# Patient Record
Sex: Male | Born: 1950 | Race: Black or African American | Hispanic: No | Smoking: Never smoker
Health system: Southern US, Community
[De-identification: ages and names within clinical notes are randomized; demographics above are authoritative.]

## PROBLEM LIST (undated history)

## (undated) DIAGNOSIS — C649 Malignant neoplasm of unspecified kidney, except renal pelvis: Secondary | ICD-10-CM

## (undated) DIAGNOSIS — I10 Essential (primary) hypertension: Secondary | ICD-10-CM

---

## 2018-03-23 ENCOUNTER — Emergency Department (HOSPITAL_COMMUNITY)
Admission: EM | Admit: 2018-03-23 | Discharge: 2018-03-23 | Disposition: A | Discharge status: Home / Self Care | Payer: Medicare Other | Attending: Emergency Medicine | Admitting: Emergency Medicine

## 2018-03-23 ENCOUNTER — Encounter (HOSPITAL_COMMUNITY): Payer: Self-pay | Admitting: Emergency Medicine

## 2018-03-23 ENCOUNTER — Emergency Department (HOSPITAL_COMMUNITY): Payer: Medicare Other

## 2018-03-23 DIAGNOSIS — E860 Dehydration: Secondary | ICD-10-CM | POA: Diagnosis not present

## 2018-03-23 DIAGNOSIS — R55 Syncope and collapse: Secondary | ICD-10-CM | POA: Diagnosis present

## 2018-03-23 DIAGNOSIS — E876 Hypokalemia: Secondary | ICD-10-CM | POA: Insufficient documentation

## 2018-03-23 LAB — CBC WITH DIFFERENTIAL/PLATELET
BASOS ABS: 0 10*3/uL (ref 0.0–0.1)
BASOS PCT: 0 %
Eosinophils Absolute: 0 10*3/uL (ref 0.0–0.7)
Eosinophils Relative: 0 %
HCT: 39.2 % (ref 39.0–52.0)
Hemoglobin: 13.6 g/dL (ref 13.0–17.0)
Lymphocytes Relative: 17 %
Lymphs Abs: 1.2 10*3/uL (ref 0.7–4.0)
MCH: 30.2 pg (ref 26.0–34.0)
MCHC: 34.7 g/dL (ref 30.0–36.0)
MCV: 86.9 fL (ref 78.0–100.0)
MONO ABS: 0.9 10*3/uL (ref 0.1–1.0)
Monocytes Relative: 12 %
Neutro Abs: 4.9 10*3/uL (ref 1.7–7.7)
Neutrophils Relative %: 71 %
PLATELETS: 193 10*3/uL (ref 150–400)
RBC: 4.51 MIL/uL (ref 4.22–5.81)
RDW: 14.2 % (ref 11.5–15.5)
WBC: 7 10*3/uL (ref 4.0–10.5)

## 2018-03-23 LAB — LIPASE, BLOOD: Lipase: 41 U/L (ref 11–51)

## 2018-03-23 LAB — COMPREHENSIVE METABOLIC PANEL
ALT: 20 U/L (ref 17–63)
ANION GAP: 14 (ref 5–15)
AST: 30 U/L (ref 15–41)
Albumin: 3.6 g/dL (ref 3.5–5.0)
Alkaline Phosphatase: 36 U/L — ABNORMAL LOW (ref 38–126)
BUN: 43 mg/dL — ABNORMAL HIGH (ref 6–20)
CHLORIDE: 101 mmol/L (ref 101–111)
CO2: 24 mmol/L (ref 22–32)
Calcium: 8.5 mg/dL — ABNORMAL LOW (ref 8.9–10.3)
Creatinine, Ser: 2.45 mg/dL — ABNORMAL HIGH (ref 0.61–1.24)
GFR calc non Af Amer: 26 mL/min — ABNORMAL LOW (ref >60)
GFR, EST AFRICAN AMERICAN: 30 mL/min — AB (ref >60)
Glucose, Bld: 115 mg/dL — ABNORMAL HIGH (ref 65–99)
POTASSIUM: 2.7 mmol/L — AB (ref 3.5–5.1)
Sodium: 139 mmol/L (ref 135–145)
Total Bilirubin: 1.1 mg/dL (ref 0.3–1.2)
Total Protein: 6.4 g/dL — ABNORMAL LOW (ref 6.5–8.1)

## 2018-03-23 LAB — I-STAT TROPONIN, ED: Troponin i, poc: 0.05 ng/mL (ref 0.00–0.08)

## 2018-03-23 MED ORDER — POTASSIUM CHLORIDE CRYS ER 20 MEQ PO TBCR
40.0000 meq | EXTENDED_RELEASE_TABLET | Freq: Once | ORAL | Status: AC
  Administered 2018-03-23: 40 meq via ORAL
  Filled 2018-03-23: qty 2

## 2018-03-23 MED ORDER — SODIUM CHLORIDE 0.9 % IV BOLUS
1000.0000 mL | Freq: Once | INTRAVENOUS | Status: AC
  Administered 2018-03-23: 1000 mL via INTRAVENOUS

## 2018-03-23 MED ORDER — ONDANSETRON HCL 4 MG/2ML IJ SOLN
4.0000 mg | Freq: Once | INTRAMUSCULAR | Status: AC
  Administered 2018-03-23: 4 mg via INTRAVENOUS
  Filled 2018-03-23: qty 2

## 2018-03-23 NOTE — ED Triage Notes (Signed)
Pt arrives via EMS with complaints of dizziness. Denies any SOB, CP or LOC. Reports this has happened before and he has been seen for it. Reports a recent stomach virus.

## 2018-03-23 NOTE — Discharge Instructions (Addendum)
Your evaluated in the emergency department for a syncopal event.  We found you to be dehydrated and low in potassium.  We have given you some fluids and some oral potassium and you were improved.  You were offered admission for continued fluids and observation but you wish to go home.  It is important that you stay well-hydrated and I would recommend Gatorade or other electrolyte solutions to replete some of your electrolytes.  Please see your doctor tomorrow and have them recheck your labs.  Today your potassium was 2.7 and your creatinine was 2.45.  This was prior to getting medications and fluids.

## 2018-03-23 NOTE — ED Provider Notes (Signed)
[facility] EMERGENCY DEPARTMENT Provider Note   CSN: [id number] Arrival date & time: 03/23/18  1313     History   Chief Complaint Chief Complaint  Patient presents with  . Dizziness    HPI Zachary Baker is a 67 y.o. male.  Patient presents to the ED after experiencing a syncopal event at home.  His significant other was there and states he was leaving the house, felt a little nauseous and then he started staggering and was assisted to the ground.  It sounds like he improved after being on the ground and then felt lightheaded again and EMS was called.  Since then he has been alert and feels back to baseline.  Says felt a little nauseous over the past few days.  He relates that he had a head injury in the service about 40 years ago and since then he has had syncopal events.  He states he usually gets them multiple times a year if not more frequently.  He is not known to have any heart history.  The history is provided by the patient, a relative and the spouse.  Loss of Consciousness   This is a recurrent problem. The current episode started less than 1 hour ago. The problem has been resolved. He lost consciousness for a period of less than one minute. The problem is associated with normal activity. Associated symptoms include clumsiness, diaphoresis, dizziness and nausea. Pertinent negatives include abdominal pain, back pain, chest pain, fever, palpitations, seizures and vomiting. He has tried relaxation for the symptoms. The treatment provided significant relief.    History reviewed. No pertinent past medical history.  There are no active problems to display for this patient.   History reviewed. No pertinent surgical history.      Home Medications    Prior to Admission medications   Not on File    Family History No family history on file.  Social History Social History   Tobacco Use  . Smoking status: Not on file  Substance Use Topics  . Alcohol use:  Not on file  . Drug use: Not on file  denies drugs   Allergies   Patient has no known allergies.   Review of Systems Review of Systems  Constitutional: Positive for diaphoresis. Negative for chills and fever.  HENT: Negative for ear pain and sore throat.   Eyes: Negative for pain and visual disturbance.  Respiratory: Negative for cough and shortness of breath.   Cardiovascular: Positive for syncope. Negative for chest pain and palpitations.  Gastrointestinal: Positive for nausea. Negative for abdominal pain and vomiting.  Genitourinary: Negative for dysuria and hematuria.  Musculoskeletal: Negative for arthralgias and back pain.  Skin: Negative for color change and rash.  Neurological: Positive for dizziness. Negative for seizures and syncope.  All other systems reviewed and are negative.    Physical Exam Updated Vital Signs BP (!) 141/85   Pulse 67   Temp 97.8 F (36.6 C) (Oral)   Resp 16   SpO2 99%   Physical Exam  Constitutional: He is oriented to person, place, and time. He appears well-developed and well-nourished.  HENT:  Head: Normocephalic and atraumatic.  Eyes: Conjunctivae are normal.  Neck: Neck supple.  Cardiovascular: Normal rate and regular rhythm.  No murmur heard. Pulmonary/Chest: Effort normal and breath sounds normal. No respiratory distress.  Abdominal: Soft. There is no tenderness.  Musculoskeletal: Normal range of motion. He exhibits no edema, tenderness or deformity.  Neurological: He is alert and oriented  to person, place, and time. He has normal strength. No cranial nerve deficit or sensory deficit. GCS eye subscore is 4. GCS verbal subscore is 5. GCS motor subscore is 6.  Skin: Skin is warm and dry. Capillary refill takes less than 2 seconds.  Psychiatric: He has a normal mood and affect.  Nursing note and vitals reviewed.    ED Treatments / Results  Labs (all labs ordered are listed, but only abnormal results are displayed) Labs  Reviewed  COMPREHENSIVE METABOLIC PANEL - Abnormal; Notable for the following components:      Result Value   Potassium 2.7 (*)    Glucose, Bld 115 (*)    BUN 43 (*)    Creatinine, Ser 2.45 (*)    Calcium 8.5 (*)    Total Protein 6.4 (*)    Alkaline Phosphatase 36 (*)    GFR calc non Af Amer 26 (*)    GFR calc Af Amer 30 (*)    All other components within normal limits  CBC WITH DIFFERENTIAL/PLATELET  LIPASE, BLOOD  CBG MONITORING, ED  I-STAT TROPONIN, ED    EKG EKG Interpretation  Date/Time:  Sunday Mar 23 2018 14:02:07 EDT Ventricular Rate:  62 PR Interval:    QRS Duration: 93 QT Interval:  468 QTC Calculation: 476 R Axis:   29 Text Interpretation:  Sinus rhythm Borderline repolarization abnormality Minimal ST elevation, anterior leads Borderline prolonged QT interval no prior to compare with Confirmed by Aletta Edouard [phone]) on 03/23/2018 2:05:49 PM Also confirmed by Aletta Edouard [phone]), editor Abelardo Diesel [phone])  on 03/23/2018 2:12:17 PM   Radiology Dg Chest 2 View  Result Date: 03/23/2018 CLINICAL DATA:  Syncopal episode.  Nausea with dizziness. EXAM: CHEST - 2 VIEW COMPARISON:  None. FINDINGS: Lordotic positioning on the AP view. The heart size and mediastinal contours are normal. There is mild aortic atherosclerosis. The lungs are clear. There is no pleural effusion or pneumothorax. No acute osseous findings are evident. Telemetry leads overlie the chest. IMPRESSION: No active cardiopulmonary process. Electronically Signed   By: Richardean Sale M.D.   On: 03/23/2018 14:38    Procedures Procedures (including critical care time)  Medications Ordered in ED Medications  sodium chloride 0.9 % bolus 1,000 mL (0 mLs Intravenous Stopped 03/23/18 1539)  ondansetron (ZOFRAN) injection 4 mg (4 mg Intravenous Given 03/23/18 1408)  potassium chloride SA (K-DUR,KLOR-CON) CR tablet 40 mEq (40 mEq Oral Given 03/23/18 1543)     Initial Impression / Assessment and Plan / ED  Course  I have reviewed the triage vital signs and the nursing notes.  Pertinent labs & imaging results that were available during my care of the patient were reviewed by me and considered in my medical decision making (see chart for details).  Clinical Course as of Mar 24 802  Sun Mar 23, 2018  1619 Reviewed patient's prior labs and his baseline creatinine is 115 but he had admission about a year ago for similar presentation of AKI and  hypokalemia.  We are repleting his potassium and is gotten some IV fluids.  He feels back to baseline and wants to be discharged.  I recommend to him that he stay overnight in the hospital for continued management but he states he wants to be discharged and go to the [place] tomorrow.   [MB]    Clinical Course User Index [MB] Hayden Rasmussen, MD     Final Clinical Impressions(s) / ED Diagnoses   Final diagnoses:  Dehydration  Hypokalemia    ED Discharge Orders    None       Hayden Rasmussen, MD 03/24/18 [phone]

## 2018-03-23 NOTE — ED Notes (Signed)
Patient transported to X-ray 

## 2018-03-24 ENCOUNTER — Encounter (HOSPITAL_COMMUNITY): Payer: Self-pay | Admitting: Emergency Medicine

## 2018-04-13 ENCOUNTER — Emergency Department (HOSPITAL_COMMUNITY)
Admission: EM | Admit: 2018-04-13 | Discharge: 2018-04-13 | Disposition: A | Discharge status: Home / Self Care | Payer: Medicare Other | Attending: Emergency Medicine | Admitting: Emergency Medicine

## 2018-04-13 ENCOUNTER — Encounter (HOSPITAL_COMMUNITY): Payer: Self-pay | Admitting: Emergency Medicine

## 2018-04-13 ENCOUNTER — Other Ambulatory Visit: Payer: Self-pay

## 2018-04-13 DIAGNOSIS — H1033 Unspecified acute conjunctivitis, bilateral: Secondary | ICD-10-CM | POA: Diagnosis present

## 2018-04-13 DIAGNOSIS — I10 Essential (primary) hypertension: Secondary | ICD-10-CM | POA: Insufficient documentation

## 2018-04-13 HISTORY — DX: Essential (primary) hypertension: I10

## 2018-04-13 MED ORDER — KETOROLAC TROMETHAMINE 0.5 % OP SOLN
1.0000 [drp] | Freq: Once | OPHTHALMIC | Status: AC
Start: 2018-04-13 — End: 2018-04-13
  Administered 2018-04-13: 1 [drp] via OPHTHALMIC
  Filled 2018-04-13: qty 5

## 2018-04-13 MED ORDER — TOBRAMYCIN 0.3 % OP SOLN
1.0000 [drp] | Freq: Once | OPHTHALMIC | Status: AC
  Administered 2018-04-13: 1 [drp] via OPHTHALMIC
  Filled 2018-04-13: qty 5

## 2018-04-13 NOTE — Discharge Instructions (Addendum)
Apply both of the drops in both of your eyes.  Apply 1 drop of the Tobrex which is the antibiotic every 4 hours while awake for the next 7 days.  The other drop which is called ketorolac is an anti-inflammatory which will help with the redness and irritation can be applied to 3 times daily, 1 drop.  Avoid rubbing your eyes, wash your hands frequently as this infection is contagious as discussed.

## 2018-04-13 NOTE — ED Provider Notes (Signed)
[facility] EMERGENCY DEPARTMENT Provider Note   CSN: [id number] Arrival date & time: 04/13/18  1053     History   Chief Complaint Chief Complaint  Patient presents with  . Eye Problem    HPI Nahsir Venezia is a 67 y.o. male presenting with eye irritation including redness and drainage with crusting along his eyelid margins which is worsened upon first waking. His symptoms started 2 days ago in the left eye and then spread to the right yesterday.  He denies vision changes, eye pain, except for a scratchy feeling with blinking, denies foreign body sensation.  He has had no treatment prior to arrival.  He has had no known contacts with anyone with pink eye.  The history is provided by the patient.    Past Medical History:  Diagnosis Date  . Hypertension     There are no active problems to display for this patient.   History reviewed. No pertinent surgical history.      Home Medications    Prior to Admission medications   Not on File    Family History No family history on file.  Social History Social History   Tobacco Use  . Smoking status: Never Smoker  . Smokeless tobacco: Never Used  Substance Use Topics  . Alcohol use: Yes    Alcohol/week: 1.2 oz    Types: 2 Glasses of wine per week    Comment: daily   . Drug use: Not Currently     Allergies   Patient has no known allergies.   Review of Systems Review of Systems  Constitutional: Negative for chills and fever.  HENT: Negative for congestion, ear pain, rhinorrhea, sinus pressure, sore throat, trouble swallowing and voice change.   Eyes: Positive for discharge and redness. Negative for photophobia, pain, itching and visual disturbance.  Respiratory: Negative for cough, shortness of breath, wheezing and stridor.   Cardiovascular: Negative for chest pain.  Gastrointestinal: Negative for abdominal pain.  Genitourinary: Negative.      Physical Exam Updated Vital Signs BP 123/70   Pulse 65    Temp 98.6 F (37 C) (Oral)   Resp 16   Ht 5\' 11"  (1.803 m)   Wt 85.3 kg (188 lb)   SpO2 100%   BMI 26.22 kg/m   Physical Exam  Constitutional: He is oriented to person, place, and time. He appears well-developed and well-nourished.  HENT:  Head: Normocephalic and atraumatic.  Right Ear: Tympanic membrane and ear canal normal.  Left Ear: Tympanic membrane and ear canal normal.  Nose: No mucosal edema or rhinorrhea.  Mouth/Throat: Uvula is midline, oropharynx is clear and moist and mucous membranes are normal. No oropharyngeal exudate, posterior oropharyngeal edema, posterior oropharyngeal erythema or tonsillar abscesses.  Eyes: Right eye exhibits discharge. Left eye exhibits discharge. Right conjunctiva is injected. Left conjunctiva is injected.  Visual acuity  OS 20/20 OD 20/20  OU 20/20  Cardiovascular: Normal rate.  Pulmonary/Chest: Effort normal.  Musculoskeletal: Normal range of motion.  Neurological: He is alert and oriented to person, place, and time.  Skin: Skin is warm and dry. No rash noted.  Psychiatric: He has a normal mood and affect.     ED Treatments / Results  Labs (all labs ordered are listed, but only abnormal results are displayed) Labs Reviewed - No data to display  EKG None  Radiology No results found.  Procedures Procedures (including critical care time)  Medications Ordered in ED Medications  tobramycin (TOBREX) 0.3 % ophthalmic solution 1  drop (1 drop Both Eyes Given 04/13/18 1238)  ketorolac (ACULAR) 0.5 % ophthalmic solution 1 drop (1 drop Both Eyes Given 04/13/18 1236)     Initial Impression / Assessment and Plan / ED Course  I have reviewed the triage vital signs and the nursing notes.  Pertinent labs & imaging results that were available during my care of the patient were reviewed by me and considered in my medical decision making (see chart for details).     Pt with acute conjunctivitis, denies eye pain or vision changes, no hx  suggesting corneal abrasion or fb.  Started on tobrex and ketorolac gtts. Advised recheck with ophthalmology if sx are not improving over the next several days with this tx.  Return here for worsened or new sx.  Final Clinical Impressions(s) / ED Diagnoses   Final diagnoses:  Acute conjunctivitis of both eyes, unspecified acute conjunctivitis type    ED Discharge Orders    None       Landis Martins 04/14/18 [id number]    Noemi Chapel, MD 04/15/18 1147

## 2018-04-13 NOTE — ED Triage Notes (Signed)
Both eyes red, swollen and draining for past few days

## 2018-04-13 NOTE — ED Notes (Signed)
Visual acuity  OS 20/20 OD 20/20  OU 20/20

## 2019-04-05 ENCOUNTER — Other Ambulatory Visit: Payer: Self-pay

## 2019-04-05 ENCOUNTER — Emergency Department (HOSPITAL_COMMUNITY)
Admission: EM | Admit: 2019-04-05 | Discharge: 2019-04-05 | Disposition: A | Discharge status: Home / Self Care | Payer: Medicare Other | Attending: Emergency Medicine | Admitting: Emergency Medicine

## 2019-04-05 ENCOUNTER — Encounter (HOSPITAL_COMMUNITY): Payer: Self-pay

## 2019-04-05 DIAGNOSIS — H109 Unspecified conjunctivitis: Secondary | ICD-10-CM | POA: Insufficient documentation

## 2019-04-05 DIAGNOSIS — H5789 Other specified disorders of eye and adnexa: Secondary | ICD-10-CM | POA: Diagnosis present

## 2019-04-05 MED ORDER — TOBRAMYCIN 0.3 % OP SOLN: 1.0000 [drp] | OPHTHALMIC | 0 refills | Status: AC

## 2019-04-05 NOTE — ED Triage Notes (Addendum)
Pt reports he woke up feeling like something was in eyes.Started in one eye now Both eyes red with drainage

## 2019-04-05 NOTE — ED Provider Notes (Signed)
[facility] EMERGENCY DEPARTMENT Provider Note   CSN: [id number] Arrival date & time: 04/05/19  1620    History   Chief Complaint Chief Complaint  Patient presents with  . Eye Problem    HPI Zachary Baker is a 68 y.o. male.     The history is provided by the patient. No language interpreter was used.  Eye Problem  Location:  Both eyes Quality:  Aching and foreign body sensation Severity:  Moderate Onset quality:  Gradual Duration:  1 day Timing:  Constant Chronicity:  New Relieved by:  Nothing Worsened by:  Nothing Ineffective treatments:  None tried Associated symptoms: inflammation, itching and redness   Associated symptoms: no blurred vision   Pt reports he has had previous pink eye and this feels the same.  Pt reports he did sleep under a fan.  Pt denies any histroy of allergies.   Past Medical History:  Diagnosis Date  . Hypertension     There are no active problems to display for this patient.   History reviewed. No pertinent surgical history.      Home Medications    Prior to Admission medications   Not on File    Family History No family history on file.  Social History Social History   Tobacco Use  . Smoking status: Never Smoker  . Smokeless tobacco: Never Used  Substance Use Topics  . Alcohol use: Yes    Alcohol/week: 2.0 standard drinks    Types: 2 Glasses of wine per week    Comment: daily   . Drug use: Not Currently     Allergies   Patient has no known allergies.   Review of Systems Review of Systems  Eyes: Positive for pain, redness and itching. Negative for blurred vision.  All other systems reviewed and are negative.    Physical Exam Updated Vital Signs BP 139/83 (BP Location: Right Arm)   Pulse 71   Temp 98.9 F (37.2 C) (Oral)   Resp 16   SpO2 99%   Physical Exam Vitals signs and nursing note reviewed.  Constitutional:      Appearance: He is well-developed.  HENT:     Head: Normocephalic and  atraumatic.     Nose: Nose normal.     Mouth/Throat:     Mouth: Mucous membranes are moist.  Eyes:     Comments: Injected conjunctiva bilat  No foreign bodies noted.    Neck:     Musculoskeletal: Neck supple.  Cardiovascular:     Rate and Rhythm: Normal rate and regular rhythm.     Heart sounds: No murmur.  Pulmonary:     Effort: Pulmonary effort is normal. No respiratory distress.     Breath sounds: Normal breath sounds.  Abdominal:     Palpations: Abdomen is soft.     Tenderness: There is no abdominal tenderness.  Skin:    General: Skin is warm and dry.  Neurological:     General: No focal deficit present.     Mental Status: He is alert.  Psychiatric:        Mood and Affect: Mood normal.      ED Treatments / Results  Labs (all labs ordered are listed, but only abnormal results are displayed) Labs Reviewed - No data to display  EKG None  Radiology No results found.  Procedures Procedures (including critical care time)  Medications Ordered in ED Medications - No data to display   Initial Impression / Assessment and Plan / ED  Course  I have reviewed the triage vital signs and the nursing notes.  Pertinent labs & imaging results that were available during my care of the patient were reviewed by me and considered in my medical decision making (see chart for details).        MDM  Symptoms most likely allergic.  Pt advised benadryl.  I will treat with tobrex to cover.   Final Clinical Impressions(s) / ED Diagnoses   Final diagnoses:  Conjunctivitis of both eyes, unspecified conjunctivitis type    ED Discharge Orders         Ordered    tobramycin (TOBREX) 0.3 % ophthalmic solution  Every 4 hours     04/05/19 1652        An After Visit Summary was printed and given to the patient.    Fransico Meadow, [place] 04/05/19 1653    Dorie Rank, MD 04/09/19 [phone]

## 2019-05-25 IMAGING — DX DG CHEST 2V
2 series · 2 of 2 positions shown · non-contrast
Comparison: None.

CLINICAL DATA: Syncopal episode.  Nausea with dizziness.

EXAM:
CHEST - 2 VIEW

[chest ap]
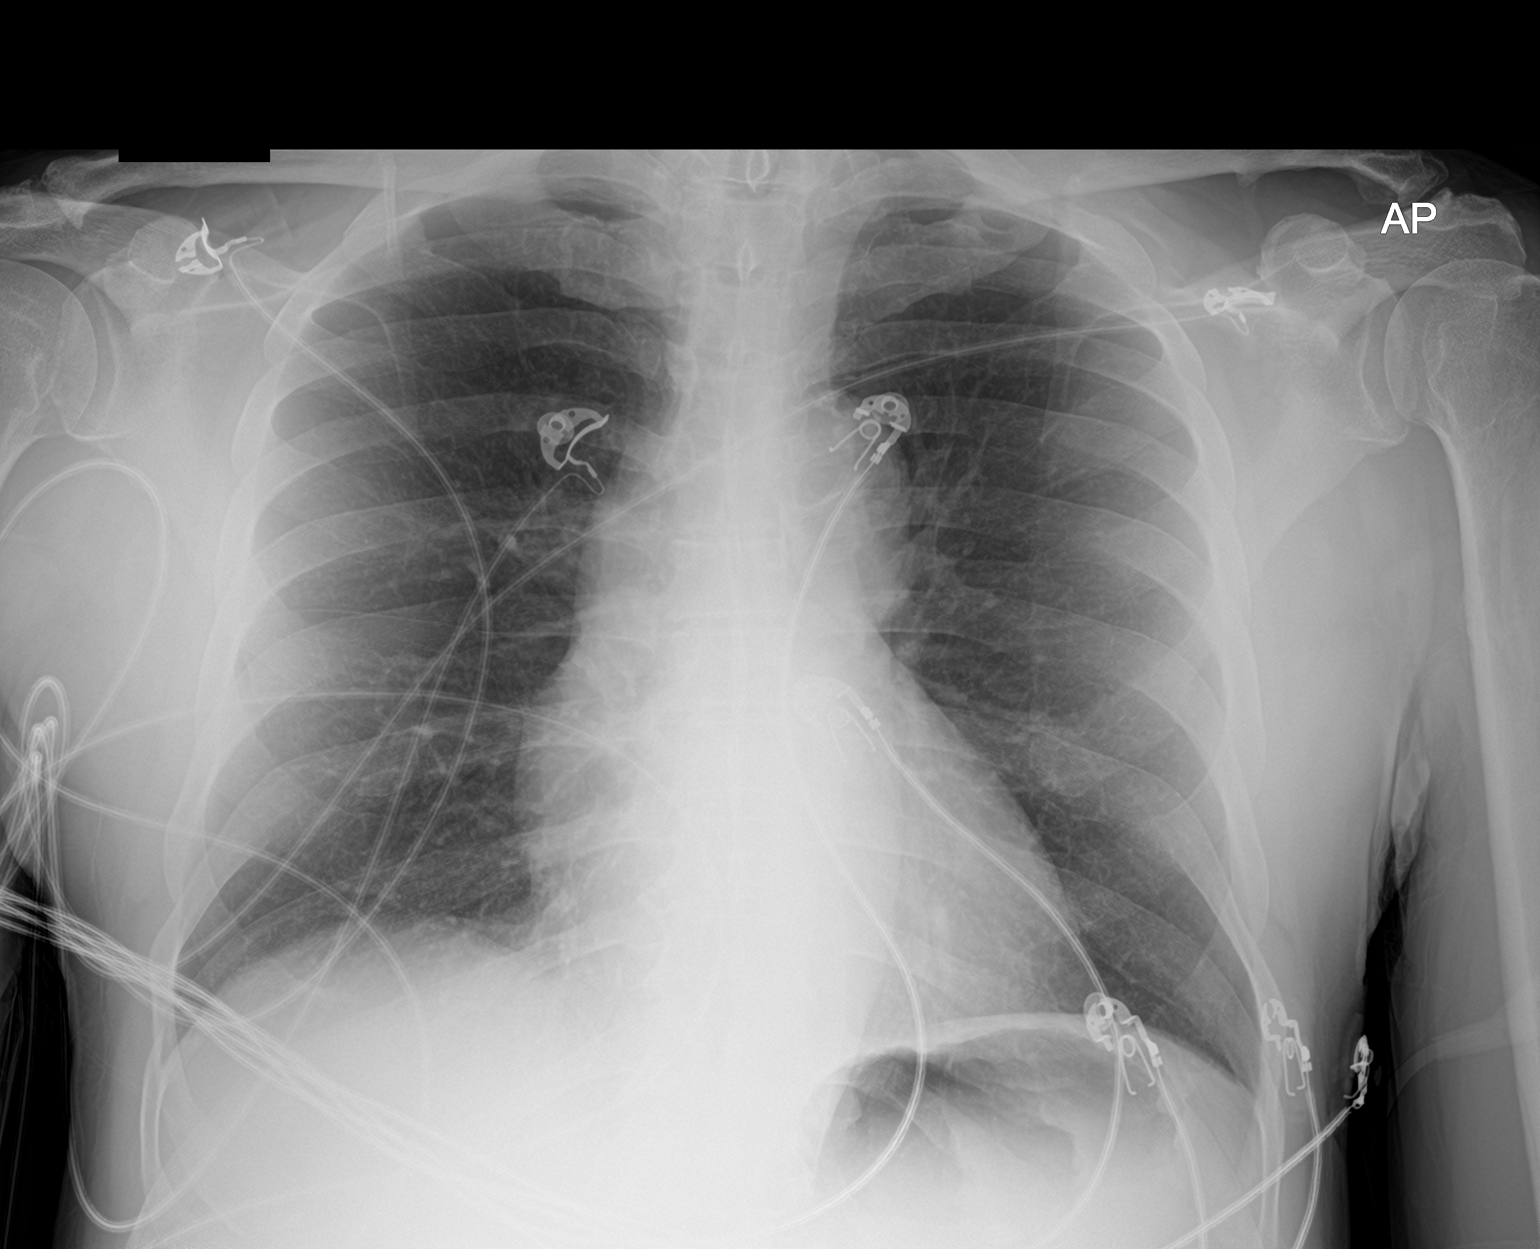

[chest lat]
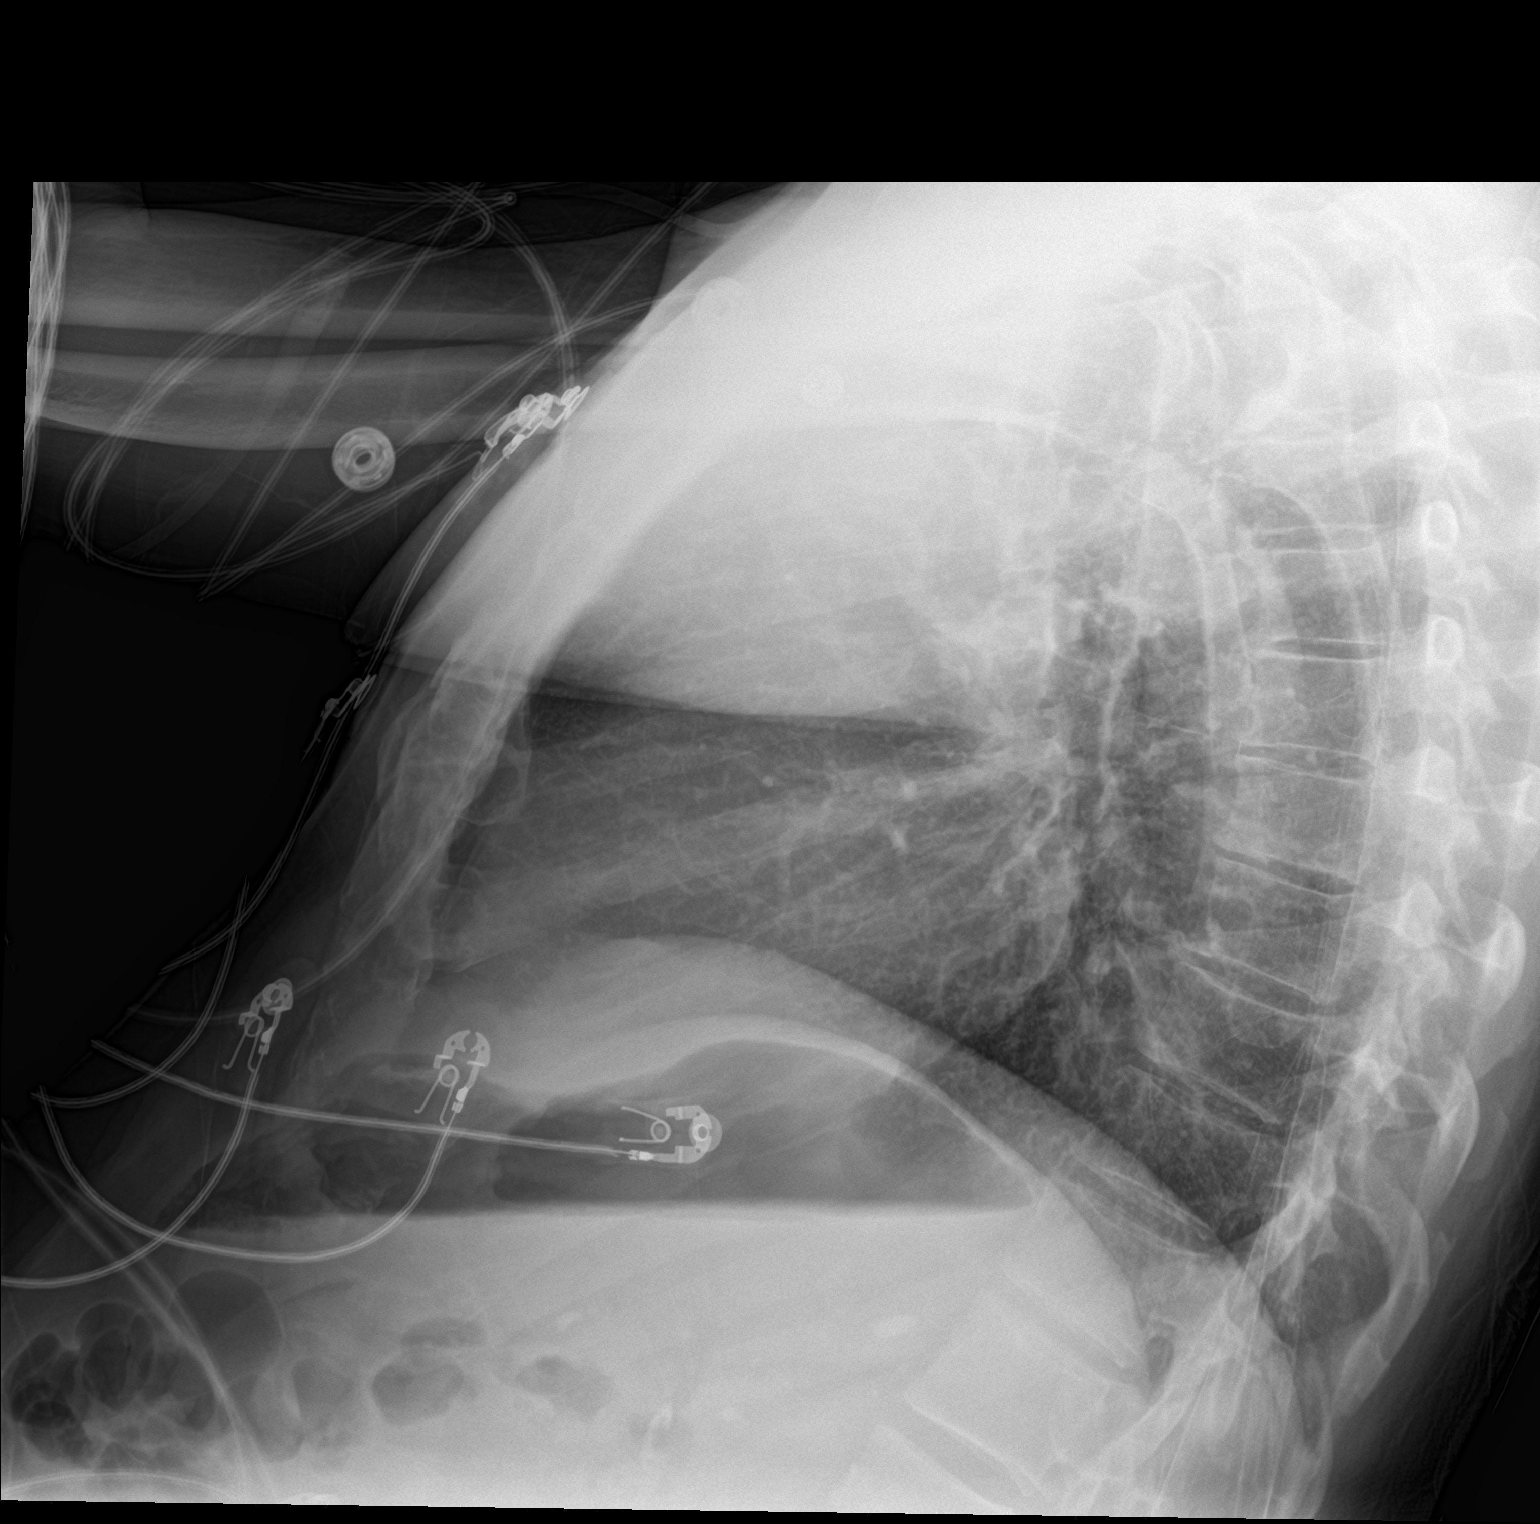

[2 of 2 positions shown; findings below may reference images not displayed]

FINDINGS: Lordotic positioning on the AP view. The heart size and mediastinal
contours are normal. There is mild aortic atherosclerosis. The lungs
are clear. There is no pleural effusion or pneumothorax. No acute
osseous findings are evident. Telemetry leads overlie the chest.
IMPRESSION: No active cardiopulmonary process.

## 2019-07-06 ENCOUNTER — Encounter (HOSPITAL_COMMUNITY): Payer: Self-pay

## 2019-07-06 ENCOUNTER — Emergency Department (HOSPITAL_COMMUNITY)
Admission: EM | Admit: 2019-07-06 | Discharge: 2019-07-06 | Disposition: A | Discharge status: Home / Self Care | Payer: Medicare Other | Attending: Emergency Medicine | Admitting: Emergency Medicine

## 2019-07-06 ENCOUNTER — Emergency Department (HOSPITAL_COMMUNITY): Payer: Medicare Other

## 2019-07-06 ENCOUNTER — Other Ambulatory Visit: Payer: Self-pay

## 2019-07-06 DIAGNOSIS — R05 Cough: Secondary | ICD-10-CM

## 2019-07-06 DIAGNOSIS — R197 Diarrhea, unspecified: Secondary | ICD-10-CM | POA: Diagnosis not present

## 2019-07-06 DIAGNOSIS — R079 Chest pain, unspecified: Secondary | ICD-10-CM

## 2019-07-06 DIAGNOSIS — Z79899 Other long term (current) drug therapy: Secondary | ICD-10-CM | POA: Insufficient documentation

## 2019-07-06 DIAGNOSIS — Z7982 Long term (current) use of aspirin: Secondary | ICD-10-CM | POA: Diagnosis not present

## 2019-07-06 DIAGNOSIS — C649 Malignant neoplasm of unspecified kidney, except renal pelvis: Secondary | ICD-10-CM | POA: Diagnosis not present

## 2019-07-06 DIAGNOSIS — Z20828 Contact with and (suspected) exposure to other viral communicable diseases: Secondary | ICD-10-CM | POA: Insufficient documentation

## 2019-07-06 DIAGNOSIS — R0602 Shortness of breath: Secondary | ICD-10-CM | POA: Diagnosis present

## 2019-07-06 DIAGNOSIS — R112 Nausea with vomiting, unspecified: Secondary | ICD-10-CM | POA: Insufficient documentation

## 2019-07-06 DIAGNOSIS — R091 Pleurisy: Secondary | ICD-10-CM | POA: Diagnosis not present

## 2019-07-06 DIAGNOSIS — R059 Cough, unspecified: Secondary | ICD-10-CM

## 2019-07-06 DIAGNOSIS — I10 Essential (primary) hypertension: Secondary | ICD-10-CM | POA: Diagnosis not present

## 2019-07-06 HISTORY — DX: Malignant neoplasm of unspecified kidney, except renal pelvis: C64.9

## 2019-07-06 LAB — TROPONIN I (HIGH SENSITIVITY)
Troponin I (High Sensitivity): 21 ng/L — ABNORMAL HIGH (ref <18)
Troponin I (High Sensitivity): 23 ng/L — ABNORMAL HIGH (ref <18)

## 2019-07-06 LAB — D-DIMER, QUANTITATIVE: D-Dimer, Quant: 0.42 ug/mL-FEU (ref 0.00–0.50)

## 2019-07-06 LAB — COMPREHENSIVE METABOLIC PANEL
ALT: 25 U/L (ref 0–44)
AST: 30 U/L (ref 15–41)
Albumin: 4.7 g/dL (ref 3.5–5.0)
Alkaline Phosphatase: 37 U/L — ABNORMAL LOW (ref 38–126)
Anion gap: 16 — ABNORMAL HIGH (ref 5–15)
BUN: 52 mg/dL — ABNORMAL HIGH (ref 8–23)
CO2: 26 mmol/L (ref 22–32)
Calcium: 9.6 mg/dL (ref 8.9–10.3)
Chloride: 98 mmol/L (ref 98–111)
Creatinine, Ser: 3.12 mg/dL — ABNORMAL HIGH (ref 0.61–1.24)
GFR calc Af Amer: 23 mL/min — ABNORMAL LOW (ref >60)
GFR calc non Af Amer: 20 mL/min — ABNORMAL LOW (ref >60)
Glucose, Bld: 151 mg/dL — ABNORMAL HIGH (ref 70–99)
Potassium: 3.2 mmol/L — ABNORMAL LOW (ref 3.5–5.1)
Sodium: 140 mmol/L (ref 135–145)
Total Bilirubin: 0.9 mg/dL (ref 0.3–1.2)
Total Protein: 8.4 g/dL — ABNORMAL HIGH (ref 6.5–8.1)

## 2019-07-06 LAB — SARS CORONAVIRUS 2 BY RT PCR (HOSPITAL ORDER, PERFORMED IN ~~LOC~~ HOSPITAL LAB): SARS Coronavirus 2: NEGATIVE

## 2019-07-06 LAB — CBC WITH DIFFERENTIAL/PLATELET
Abs Immature Granulocytes: 0.03 10*3/uL (ref 0.00–0.07)
Basophils Absolute: 0 10*3/uL (ref 0.0–0.1)
Basophils Relative: 0 %
Eosinophils Absolute: 0 10*3/uL (ref 0.0–0.5)
Eosinophils Relative: 0 %
HCT: 43.8 % (ref 39.0–52.0)
Hemoglobin: 14.8 g/dL (ref 13.0–17.0)
Immature Granulocytes: 0 %
Lymphocytes Relative: 10 %
Lymphs Abs: 1 10*3/uL (ref 0.7–4.0)
MCH: 30.3 pg (ref 26.0–34.0)
MCHC: 33.8 g/dL (ref 30.0–36.0)
MCV: 89.6 fL (ref 80.0–100.0)
Monocytes Absolute: 0.9 10*3/uL (ref 0.1–1.0)
Monocytes Relative: 9 %
Neutro Abs: 8 10*3/uL — ABNORMAL HIGH (ref 1.7–7.7)
Neutrophils Relative %: 81 %
Platelets: 272 10*3/uL (ref 150–400)
RBC: 4.89 MIL/uL (ref 4.22–5.81)
RDW: 13.9 % (ref 11.5–15.5)
WBC: 10 10*3/uL (ref 4.0–10.5)
nRBC: 0 % (ref 0.0–0.2)

## 2019-07-06 LAB — LACTIC ACID, PLASMA: Lactic Acid, Venous: 2.6 mmol/L (ref 0.5–1.9)

## 2019-07-06 LAB — CK: Total CK: 417 U/L — ABNORMAL HIGH (ref 49–397)

## 2019-07-06 LAB — C-REACTIVE PROTEIN: CRP: <0.8 mg/dL (ref <1.0)

## 2019-07-06 LAB — LACTATE DEHYDROGENASE: LDH: 157 U/L (ref 98–192)

## 2019-07-06 MED ORDER — SODIUM CHLORIDE 0.9 % IV BOLUS
1000.0000 mL | Freq: Once | INTRAVENOUS | Status: AC
  Administered 2019-07-06: 1000 mL via INTRAVENOUS

## 2019-07-06 MED ORDER — ONDANSETRON HCL 4 MG/2ML IJ SOLN
4.0000 mg | Freq: Once | INTRAMUSCULAR | Status: AC
  Administered 2019-07-06: 4 mg via INTRAVENOUS
  Filled 2019-07-06: qty 2

## 2019-07-06 MED ORDER — SPIRONOLACTONE 25 MG PO TABS
25.0000 mg | ORAL_TABLET | Freq: Every day | ORAL | Status: DC
  Administered 2019-07-06: 25 mg via ORAL
  Filled 2019-07-06 (×4): qty 1

## 2019-07-06 MED ORDER — ATENOLOL 25 MG PO TABS
75.0000 mg | ORAL_TABLET | Freq: Every day | ORAL | Status: DC
  Administered 2019-07-06: 13:00:00 75 mg via ORAL
  Filled 2019-07-06: qty 3

## 2019-07-06 MED ORDER — HYDRALAZINE HCL 25 MG PO TABS
100.0000 mg | ORAL_TABLET | Freq: Two times a day (BID) | ORAL | Status: DC
  Administered 2019-07-06: 100 mg via ORAL
  Filled 2019-07-06: qty 4

## 2019-07-06 MED ORDER — CHLORTHALIDONE 25 MG PO TABS
25.0000 mg | ORAL_TABLET | Freq: Every day | ORAL | Status: DC
  Administered 2019-07-06: 25 mg via ORAL
  Filled 2019-07-06 (×3): qty 1

## 2019-07-06 NOTE — Discharge Instructions (Signed)
Your testing today shows that you do not have a blood clot, you do not have a heart attack and you do not test positive for coronavirus. You may still have coronavirus however the rest of your tests look good. Please quarantine at home for the next 10 days however if you should develop severe or worsening difficulty breathing, chest pain fevers or shortness of breath return to the emergency department immediately Your blood pressure has been very elevated but we have given you your daily medications, this should come down. See your doctor in 48 hours for recheck. Thank you for letting us take care of you today!  Please obtain all of your results from medical records or have your doctors office obtain the results - share them with your doctor - you should be seen at your doctors office in the next 2 days. Call today to arrange your follow up. Take the medications as prescribed. Please review all of the medicines and only take them if you do not have an allergy to them. Please be aware that if you are taking birth control pills, taking other prescriptions, ESPECIALLY ANTIBIOTICS may make the birth control ineffective - if this is the case, either do not engage in sexual activity or use alternative methods of birth control such as condoms until you have finished the medicine and your family doctor says it is OK to restart them. If you are on a blood thinner such as COUMADIN, be aware that any other medicine that you take may cause the coumadin to either work too much, or not enough - you should have your coumadin level rechecked in next 7 days if this is the case.  ?  It is also a possibility that you have an allergic reaction to any of the medicines that you have been prescribed - Everybody reacts differently to medications and while MOST people have no trouble with most medicines, you may have a reaction such as nausea, vomiting, rash, swelling, shortness of breath. If this is the case, please stop taking  the medicine immediately and contact your physician.   If you were given a medication in the ED such as percocet, vicodin, or morphine, be aware that these medicines are sedating and may change your ability to take care of yourself adequately for several hours after being given this medicines - you should not drive or take care of small children if you were given this medicine in the Emergency Department or if you have been prescribed these types of medicines. ?   You should return to the ER IMMEDIATELY if you develop severe or worsening symptoms.

## 2019-07-06 NOTE — ED Triage Notes (Signed)
Pt started having SOB on Saturday. Has been having a cough, SOB, diarrhea, and fatigue. 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 VSS. Was supposed to get a kidney surgery today.

## 2019-07-06 NOTE — ED Notes (Signed)
Date and time results received: 07/06/19 0915 (use smartphrase ".now" to insert current time)  Test: lactic acid Critical Value: 2.6  Name of Provider Notified: Dr. Sabra Heck  Orders Received? Or Actions Taken?: new orders in place

## 2019-07-06 NOTE — ED Provider Notes (Signed)
[facility] EMERGENCY DEPARTMENT Provider Note   CSN: [id number] Arrival date & time: 07/06/19  0017    History   Chief Complaint Chief Complaint  Patient presents with  . Shortness of Breath    HPI Zachary Baker is a 68 y.o. male.     HPI  The patient is a 68 year old male, he has a known history of kidney cancer as well as hypertension.  He was supposed to go for a left-sided nephrectomy this morning however over the last several days he has had a progressive syndrome including shortness of breath left-sided pins-and-needles chest discomfort and then nausea vomiting and diarrhea over the last several days.  He has felt hot and sweaty but has not had a temperature over 100 at home.  It was recommended that he come to the hospital to be evaluated for coronavirus or other causes of his discomfort and not to have surgery today given his symptoms.  He states he feels dehydrated.  Symptoms are persistent, nothing seems to make it better or worse, it is associated with some coughing.  Past Medical History:  Diagnosis Date  . Cancer of kidney ([place])   . Hypertension     There are no active problems to display for this patient.   History reviewed. No pertinent surgical history.      Home Medications    Prior to Admission medications   Medication Sig Start Date End Date Taking? Authorizing Provider  atenolol (TENORMIN) 25 MG tablet Take 75 mg by mouth daily.    Yes [provider]  atorvastatin (LIPITOR) 80 MG tablet Take 1 tablet by mouth daily.   Yes [provider]  capsaicin (ZOSTRIX) 0.025 % cream Apply 1 application topically 2 (two) times daily as needed.   Yes [provider]  chlorthalidone (HYGROTON) 25 MG tablet Take 1 tablet by mouth daily. 05/22/19  Yes [provider]  hydrALAZINE (APRESOLINE) 100 MG tablet Take 1 tablet by mouth 2 (two) times daily.   Yes [provider]  potassium chloride (K-DUR) 10 MEQ tablet Take 1  tablet by mouth 2 (two) times daily. 06/29/19  Yes [provider]  REFRESH 1.4-0.6 % SOLN Place 1 drop into both eyes daily as needed. 05/01/19  Yes [provider]  spironolactone (ALDACTONE) 25 MG tablet Take 25 mg by mouth daily.   Yes [provider]  tobramycin (TOBREX) 0.3 % ophthalmic solution Place 1 drop into both eyes 4 (four) times daily as needed.  04/30/19  Yes [provider]  aspirin EC 81 MG tablet Take 162 mg by mouth daily.    [provider]  Omega-3 Fatty Acids (FISH OIL) 1000 MG CAPS Take 1 capsule by mouth daily.    [provider]    Family History No family history on file.  Social History Social History   Tobacco Use  . Smoking status: Never Smoker  . Smokeless tobacco: Never Used  Substance Use Topics  . Alcohol use: Yes    Alcohol/week: 2.0 standard drinks    Types: 2 Glasses of wine per week    Comment: daily   . Drug use: Not Currently     Allergies   Patient has no known allergies.   Review of Systems Review of Systems  All other systems reviewed and are negative.    Physical Exam Updated Vital Signs BP (!) 209/97   Pulse (!) 59   Temp 99.8 F (37.7 C) (Oral)   Resp 10  Ht 1.803 m (5\' 11" )   Wt 83.9 kg   SpO2 99%   BMI 25.80 kg/m   Physical Exam Vitals signs and nursing note reviewed.  Constitutional:      General: He is not in acute distress.    Appearance: He is well-developed.  HENT:     Head: Normocephalic and atraumatic.     Mouth/Throat:     Pharynx: No oropharyngeal exudate.  Eyes:     General: No scleral icterus.       Right eye: No discharge.        Left eye: No discharge.     Conjunctiva/sclera: Conjunctivae normal.     Pupils: Pupils are equal, round, and reactive to light.  Neck:     Musculoskeletal: Normal range of motion and neck supple.     Thyroid: No thyromegaly.     Vascular: No JVD.  Cardiovascular:     Rate and Rhythm: Normal rate and regular  rhythm.     Heart sounds: Normal heart sounds. No murmur. No friction rub. No gallop.   Pulmonary:     Effort: Pulmonary effort is normal. No respiratory distress.     Breath sounds: Normal breath sounds. No wheezing or rales.     Comments: Tachypneic but able to speak in just short and sentences, normal lung sounds Abdominal:     General: Bowel sounds are normal. There is no distension.     Palpations: Abdomen is soft. There is no mass.     Tenderness: There is no abdominal tenderness.  Musculoskeletal: Normal range of motion.        General: No tenderness.  Lymphadenopathy:     Cervical: No cervical adenopathy.  Skin:    General: Skin is warm and dry.     Findings: No erythema or rash.  Neurological:     Mental Status: He is alert.     Coordination: Coordination normal.  Psychiatric:        Behavior: Behavior normal.      ED Treatments / Results  Labs (all labs ordered are listed, but only abnormal results are displayed) Labs Reviewed  CBC WITH DIFFERENTIAL/PLATELET - Abnormal; Notable for the following components:      Result Value   Neutro Abs 8.0 (*)    All other components within normal limits  COMPREHENSIVE METABOLIC PANEL - Abnormal; Notable for the following components:   Potassium 3.2 (*)    Glucose, Bld 151 (*)    BUN 52 (*)    Creatinine, Ser 3.12 (*)    Total Protein 8.4 (*)    Alkaline Phosphatase 37 (*)    GFR calc non Af Amer 20 (*)    GFR calc Af Amer 23 (*)    Anion gap 16 (*)    All other components within normal limits  CK - Abnormal; Notable for the following components:   Total CK 417 (*)    All other components within normal limits  LACTIC ACID, PLASMA - Abnormal; Notable for the following components:   Lactic Acid, Venous 2.6 (*)    All other components within normal limits  TROPONIN I (HIGH SENSITIVITY) - Abnormal; Notable for the following components:   Troponin I (High Sensitivity) 21 (*)    All other components within normal limits   TROPONIN I (HIGH SENSITIVITY) - Abnormal; Notable for the following components:   Troponin I (High Sensitivity) 23 (*)    All other components within normal limits  SARS CORONAVIRUS 2 (HOSPITAL ORDER, PERFORMED  [facility])  D-DIMER, QUANTITATIVE (NOT AT [facility])  C-REACTIVE PROTEIN  LACTATE DEHYDROGENASE  TROPONIN I (HIGH SENSITIVITY)    EKG EKG Interpretation  Date/Time:  Monday July 06 2019 07:13:52 EDT Ventricular Rate:  77 PR Interval:    QRS Duration: 91 QT Interval:  414 QTC Calculation: 469 R Axis:   31 Text Interpretation:  Sinus rhythm Consider left atrial enlargement Abnormal R-wave progression, early transition Probable LVH with secondary repol abnrm Baseline wander in lead(s) V6 since last tracing no significant change Confirmed by Noemi Chapel [phone]) on 07/06/2019 9:44:38 AM   Radiology Dg Chest Port 1 View  Result Date: 07/06/2019 CLINICAL DATA:  Shortness of breath, cough EXAM: PORTABLE CHEST 1 VIEW COMPARISON:  03/23/2018 FINDINGS: The heart size and mediastinal contours are within normal limits. Both lungs are clear. The visualized skeletal structures are unremarkable. IMPRESSION: No acute cardiopulmonary findings. Electronically Signed   By: Davina Poke M.D.   On: 07/06/2019 08:01    Procedures Procedures (including critical care time)  Medications Ordered in ED Medications  atenolol (TENORMIN) tablet 75 mg (has no administration in time range)  chlorthalidone (HYGROTON) tablet 25 mg (has no administration in time range)  hydrALAZINE (APRESOLINE) tablet 100 mg (has no administration in time range)  spironolactone (ALDACTONE) tablet 25 mg (has no administration in time range)  sodium chloride 0.9 % bolus 1,000 mL (0 mLs Intravenous Stopped 07/06/19 1226)  ondansetron (ZOFRAN) injection 4 mg (4 mg Intravenous Given 07/06/19 0752)  sodium chloride 0.9 % bolus 1,000 mL (0 mLs Intravenous Stopped 07/06/19 1016)     Initial Impression /  Assessment and Plan / ED Course  I have reviewed the triage vital signs and the nursing notes.  Pertinent labs & imaging results that were available during my care of the patient were reviewed by me and considered in my medical decision making (see chart for details).  Clinical Course as of Jul 05 1312  Mon Jul 06, 2019  9509 Lactic acid slightly elevated at 2.6, troponin is slightly elevated at 21, CK at 417 with a negative d-dimer, LDH and CBC with no leukocytosis.  COVID test came back negative, metabolic panel shows some mild progression in the renal dysfunction.  The patient probably would benefit from some fluids while awaiting a second troponin.  The x-ray is negative for any acute findings.   [BM]    Clinical Course User Index [BM] Noemi Chapel, MD       Other than looking tachypneic the patient has normal vital signs, his oxygen is 100%, heart rate is 75, blood pressure is slightly elevated.  He is afebrile.  At this time the patient will need to have a chest x-ray and work-up for COVID given his tachypnea.  He should not have surgery until he is stabilized.  Would also consider pulmonary embolism given his ongoing active cancer however he is not tachycardic or hypoxic.  The patient is agreeable to the work-up.  Second trop negative, without significant changes.  Pt appears otherwise well - given home BP meds - stable at this time - doubt PE, doubt ACS and no signs of pna - may be early covid  Zachary Baker was evaluated in Emergency Department on 07/06/2019 for the symptoms described in the history of present illness. He was evaluated in the context of the global COVID-19 pandemic, which necessitated consideration that the patient might be at risk for infection with the SARS-CoV-2 virus that causes COVID-19. Institutional protocols and algorithms  that pertain to the evaluation of patients at risk for COVID-19 are in a state of rapid change based on information released by regulatory  bodies including the CDC and federal and state organizations. These policies and algorithms were followed during the patient's care in the ED.   Final Clinical Impressions(s) / ED Diagnoses   Final diagnoses:  Pleurisy  Essential hypertension    ED Discharge Orders    None       Noemi Chapel, MD 07/06/19 1313

## 2019-07-07 ENCOUNTER — Encounter (HOSPITAL_COMMUNITY): Payer: Self-pay

## 2019-07-07 ENCOUNTER — Other Ambulatory Visit: Payer: Self-pay

## 2019-07-07 ENCOUNTER — Emergency Department (HOSPITAL_COMMUNITY)
Admission: EM | Admit: 2019-07-07 | Discharge: 2019-07-07 | Disposition: A | Discharge status: Home / Self Care | Payer: No Typology Code available for payment source | Attending: Emergency Medicine | Admitting: Emergency Medicine

## 2019-07-07 DIAGNOSIS — Z79899 Other long term (current) drug therapy: Secondary | ICD-10-CM | POA: Diagnosis not present

## 2019-07-07 DIAGNOSIS — I10 Essential (primary) hypertension: Secondary | ICD-10-CM | POA: Insufficient documentation

## 2019-07-07 DIAGNOSIS — Z7982 Long term (current) use of aspirin: Secondary | ICD-10-CM | POA: Insufficient documentation

## 2019-07-07 DIAGNOSIS — R112 Nausea with vomiting, unspecified: Secondary | ICD-10-CM | POA: Diagnosis not present

## 2019-07-07 LAB — COMPREHENSIVE METABOLIC PANEL
ALT: 25 U/L (ref 0–44)
AST: 29 U/L (ref 15–41)
Albumin: 4.2 g/dL (ref 3.5–5.0)
Alkaline Phosphatase: 37 U/L — ABNORMAL LOW (ref 38–126)
Anion gap: 12 (ref 5–15)
BUN: 41 mg/dL — ABNORMAL HIGH (ref 8–23)
CO2: 23 mmol/L (ref 22–32)
Calcium: 9.3 mg/dL (ref 8.9–10.3)
Chloride: 99 mmol/L (ref 98–111)
Creatinine, Ser: 1.86 mg/dL — ABNORMAL HIGH (ref 0.61–1.24)
GFR calc Af Amer: 43 mL/min — ABNORMAL LOW (ref >60)
GFR calc non Af Amer: 37 mL/min — ABNORMAL LOW (ref >60)
Glucose, Bld: 120 mg/dL — ABNORMAL HIGH (ref 70–99)
Potassium: 2.9 mmol/L — ABNORMAL LOW (ref 3.5–5.1)
Sodium: 134 mmol/L — ABNORMAL LOW (ref 135–145)
Total Bilirubin: 1.1 mg/dL (ref 0.3–1.2)
Total Protein: 7.8 g/dL (ref 6.5–8.1)

## 2019-07-07 LAB — CBC WITH DIFFERENTIAL/PLATELET
Abs Immature Granulocytes: 0.06 10*3/uL (ref 0.00–0.07)
Basophils Absolute: 0 10*3/uL (ref 0.0–0.1)
Basophils Relative: 1 %
Eosinophils Absolute: 0 10*3/uL (ref 0.0–0.5)
Eosinophils Relative: 0 %
HCT: 41.7 % (ref 39.0–52.0)
Hemoglobin: 14.1 g/dL (ref 13.0–17.0)
Immature Granulocytes: 1 %
Lymphocytes Relative: 22 %
Lymphs Abs: 1.7 10*3/uL (ref 0.7–4.0)
MCH: 30.1 pg (ref 26.0–34.0)
MCHC: 33.8 g/dL (ref 30.0–36.0)
MCV: 88.9 fL (ref 80.0–100.0)
Monocytes Absolute: 0.9 10*3/uL (ref 0.1–1.0)
Monocytes Relative: 11 %
Neutro Abs: 5.1 10*3/uL (ref 1.7–7.7)
Neutrophils Relative %: 65 %
Platelets: 289 10*3/uL (ref 150–400)
RBC: 4.69 MIL/uL (ref 4.22–5.81)
RDW: 13.5 % (ref 11.5–15.5)
WBC: 7.7 10*3/uL (ref 4.0–10.5)
nRBC: 0 % (ref 0.0–0.2)

## 2019-07-07 LAB — URINALYSIS, ROUTINE W REFLEX MICROSCOPIC
Bacteria, UA: NONE SEEN
Bilirubin Urine: NEGATIVE
Glucose, UA: NEGATIVE mg/dL
Hgb urine dipstick: NEGATIVE
Ketones, ur: 20 mg/dL — AB
Leukocytes,Ua: NEGATIVE
Nitrite: NEGATIVE
Protein, ur: 30 mg/dL — AB
Specific Gravity, Urine: 1.02 (ref 1.005–1.030)
pH: 6 (ref 5.0–8.0)

## 2019-07-07 LAB — TROPONIN I (HIGH SENSITIVITY)
Troponin I (High Sensitivity): 17 ng/L (ref <18)
Troponin I (High Sensitivity): 16 ng/L (ref <18)

## 2019-07-07 MED ORDER — ONDANSETRON HCL 4 MG/2ML IJ SOLN
4.0000 mg | Freq: Once | INTRAMUSCULAR | Status: AC
  Administered 2019-07-07: 4 mg via INTRAVENOUS
  Filled 2019-07-07: qty 2

## 2019-07-07 MED ORDER — SODIUM CHLORIDE 0.9 % IV BOLUS
1000.0000 mL | Freq: Once | INTRAVENOUS | Status: AC
  Administered 2019-07-07: 17:00:00 1000 mL via INTRAVENOUS

## 2019-07-07 NOTE — ED Provider Notes (Signed)
[facility] EMERGENCY DEPARTMENT Provider Note   CSN: [id number] Arrival date & time: 07/07/19  1600     History   Chief Complaint Chief Complaint  Patient presents with  . Emesis    HPI Zachary Baker is a 68 y.o. male.     The history is provided by the patient. No language interpreter was used.  Emesis Severity:  Moderate Duration:  1 day Timing:  Constant Number of daily episodes:  One Quality:  Unable to specify Able to tolerate:  Liquids Progression:  Improving Chronicity:  New Recent urination:  Normal Relieved by:  Nothing Worsened by:  Nothing Ineffective treatments:  None tried  Pt was seen here yesterday for chest pain.  Pt reports he vomited today and still has some discomfort in his chest.  Pt is worried that he is dehydrated.  Pt has a histroy of kidney cancer and was supposed to have a kidney removed yesterday.  Past Medical History:  Diagnosis Date  . Cancer of kidney ([place])   . Hypertension     There are no active problems to display for this patient.   History reviewed. No pertinent surgical history.      Home Medications    Prior to Admission medications   Medication Sig Start Date End Date Taking? Authorizing Provider  atenolol (TENORMIN) 25 MG tablet Take 75 mg by mouth daily.    Yes [provider]  atorvastatin (LIPITOR) 80 MG tablet Take 1 tablet by mouth daily.   Yes [provider]  capsaicin (ZOSTRIX) 0.025 % cream Apply 1 application topically 2 (two) times daily as needed.   Yes [provider]  chlorthalidone (HYGROTON) 25 MG tablet Take 1 tablet by mouth daily. 05/22/19  Yes [provider]  hydrALAZINE (APRESOLINE) 100 MG tablet Take 1 tablet by mouth 2 (two) times daily.   Yes [provider]  potassium chloride (K-DUR) 10 MEQ tablet Take 1 tablet by mouth 2 (two) times daily. 06/29/19  Yes [provider]  REFRESH 1.4-0.6 % SOLN Place 1 drop into both eyes daily as needed  (for dry eye relief).  05/01/19  Yes [provider]  spironolactone (ALDACTONE) 25 MG tablet Take 25 mg by mouth daily.   Yes [provider]  tobramycin (TOBREX) 0.3 % ophthalmic solution Place 1 drop into both eyes 4 (four) times daily as needed.  04/30/19  Yes [provider]  aspirin EC 81 MG tablet Take 162 mg by mouth daily.    [provider]  Omega-3 Fatty Acids (FISH OIL) 1000 MG CAPS Take 1 capsule by mouth daily.    [provider]    Family History No family history on file.  Social History Social History   Tobacco Use  . Smoking status: Never Smoker  . Smokeless tobacco: Never Used  Substance Use Topics  . Alcohol use: Yes    Alcohol/week: 2.0 standard drinks    Types: 2 Glasses of wine per week    Comment: daily   . Drug use: Not Currently     Allergies   Patient has no known allergies.   Review of Systems Review of Systems  Gastrointestinal: Positive for vomiting.  All other systems reviewed and are negative.    Physical Exam Updated Vital Signs BP (!) 164/82   Pulse (!) 56   Temp 98.5 F (36.9 C) (Oral)   Resp 15   Ht 5' 11.5" (1.816 m)   Wt 85.3 kg   SpO2 97%  BMI 25.86 kg/m   Physical Exam Vitals signs and nursing note reviewed.  Constitutional:      Appearance: He is well-developed.  HENT:     Head: Normocephalic and atraumatic.     Nose: Nose normal.     Mouth/Throat:     Mouth: Mucous membranes are moist.  Eyes:     Conjunctiva/sclera: Conjunctivae normal.  Neck:     Musculoskeletal: Neck supple.  Cardiovascular:     Rate and Rhythm: Normal rate and regular rhythm.     Heart sounds: No murmur.  Pulmonary:     Effort: Pulmonary effort is normal. No respiratory distress.     Breath sounds: Normal breath sounds.  Abdominal:     Palpations: Abdomen is soft.     Tenderness: There is no abdominal tenderness.  Skin:    General: Skin is warm and dry.  Neurological:     General: No focal  deficit present.     Mental Status: He is alert.  Psychiatric:        Mood and Affect: Mood normal.      ED Treatments / Results  Labs (all labs ordered are listed, but only abnormal results are displayed) Labs Reviewed  COMPREHENSIVE METABOLIC PANEL - Abnormal; Notable for the following components:      Result Value   Sodium 134 (*)    Potassium 2.9 (*)    Glucose, Bld 120 (*)    BUN 41 (*)    Creatinine, Ser 1.86 (*)    Alkaline Phosphatase 37 (*)    GFR calc non Af Amer 37 (*)    GFR calc Af Amer 43 (*)    All other components within normal limits  URINALYSIS, ROUTINE W REFLEX MICROSCOPIC - Abnormal; Notable for the following components:   Ketones, ur 20 (*)    Protein, ur 30 (*)    All other components within normal limits  CBC WITH DIFFERENTIAL/PLATELET  TROPONIN I (HIGH SENSITIVITY)  TROPONIN I (HIGH SENSITIVITY)    EKG EKG Interpretation  Date/Time:  Tuesday July 07 2019 17:04:17 EDT Ventricular Rate:  55 PR Interval:    QRS Duration: 89 QT Interval:  443 QTC Calculation: 424 R Axis:   6 Text Interpretation:  Sinus rhythm Abnormal T, consider ischemia, inferior leads Since last tracing T wave abnormality Inferior leads is more pronounced Confirmed by Daleen Bo [phone]) on 07/07/2019 5:56:15 PM   Radiology Dg Chest Port 1 View  Result Date: 07/06/2019 CLINICAL DATA:  Shortness of breath, cough EXAM: PORTABLE CHEST 1 VIEW COMPARISON:  03/23/2018 FINDINGS: The heart size and mediastinal contours are within normal limits. Both lungs are clear. The visualized skeletal structures are unremarkable. IMPRESSION: No acute cardiopulmonary findings. Electronically Signed   By: Davina Poke M.D.   On: 07/06/2019 08:01    Procedures Procedures (including critical care time)  Medications Ordered in ED Medications  sodium chloride 0.9 % bolus 1,000 mL (0 mLs Intravenous Stopped 07/07/19 1820)  ondansetron (ZOFRAN) injection 4 mg (4 mg Intravenous Given 07/07/19  1719)     Initial Impression / Assessment and Plan / ED Course  I have reviewed the triage vital signs and the nursing notes.  Pertinent labs & imaging results that were available during my care of the patient were reviewed by me and considered in my medical decision making (see chart for details).  Clinical Course as of Jul 06 2202  Tue Jul 07, 2019  1952 Comprehensive metabolic panel(!) [LS]    Clinical Course User  Index [LS] Fransico Meadow, PA-C       MDM  Pt given Iv fluids.  Pt reports he feels much better.  No nausea.  Pt reports he is suppose to see cardiology at [facility] for evaluation of chest pain.  Pt counseled on results.  He is advised to return if any problems.  Final Clinical Impressions(s) / ED Diagnoses   Final diagnoses:  Nausea and vomiting, intractability of vomiting not specified, unspecified vomiting type    ED Discharge Orders    None    An After Visit Summary was printed and given to the patient.    Sidney Ace 07/07/19 2208    Daleen Bo, MD 07/07/19 2312

## 2019-07-07 NOTE — ED Provider Notes (Signed)
  Face-to-face evaluation   History: Presents for evaluation of vomiting.  He was evaluated in the ED yesterday.  Patient is concerned about left anterior chest pain, and persistent vomiting for several days.  He did not receive antiemetics at discharge from the emergency department yesterday.  He states he contacted his providers at the [facility], and they want to do a cardiac evaluation with a cardiologist prior to his nephrectomy, for renal cancer.  Physical exam: Alert, calm and cooperative.  Abdomen soft and nontender.  He is lucid.  He does not appear volume depleted.  Medical screening examination/treatment/procedure(s) were conducted as a shared visit with non-physician practitioner(s) and myself.  I personally evaluated the patient during the encounter    Daleen Bo, MD 07/07/19 2312

## 2019-07-07 NOTE — ED Triage Notes (Signed)
Pt presents to ED with complaints of emesis. Pt was discharged from hospital yesterday. Pt last vomited an hour ago but able to keep ice chips down since them.

## 2019-07-07 NOTE — Discharge Instructions (Signed)
See your Physician for recheck.  Return if any problems.  °

## 2020-09-06 IMAGING — CR PORTABLE CHEST - 1 VIEW
1 series · 1 of 1 positions shown · non-contrast
Comparison: 03/23/2018

CLINICAL DATA: Shortness of breath, cough

EXAM:
PORTABLE CHEST 1 VIEW

[portable]
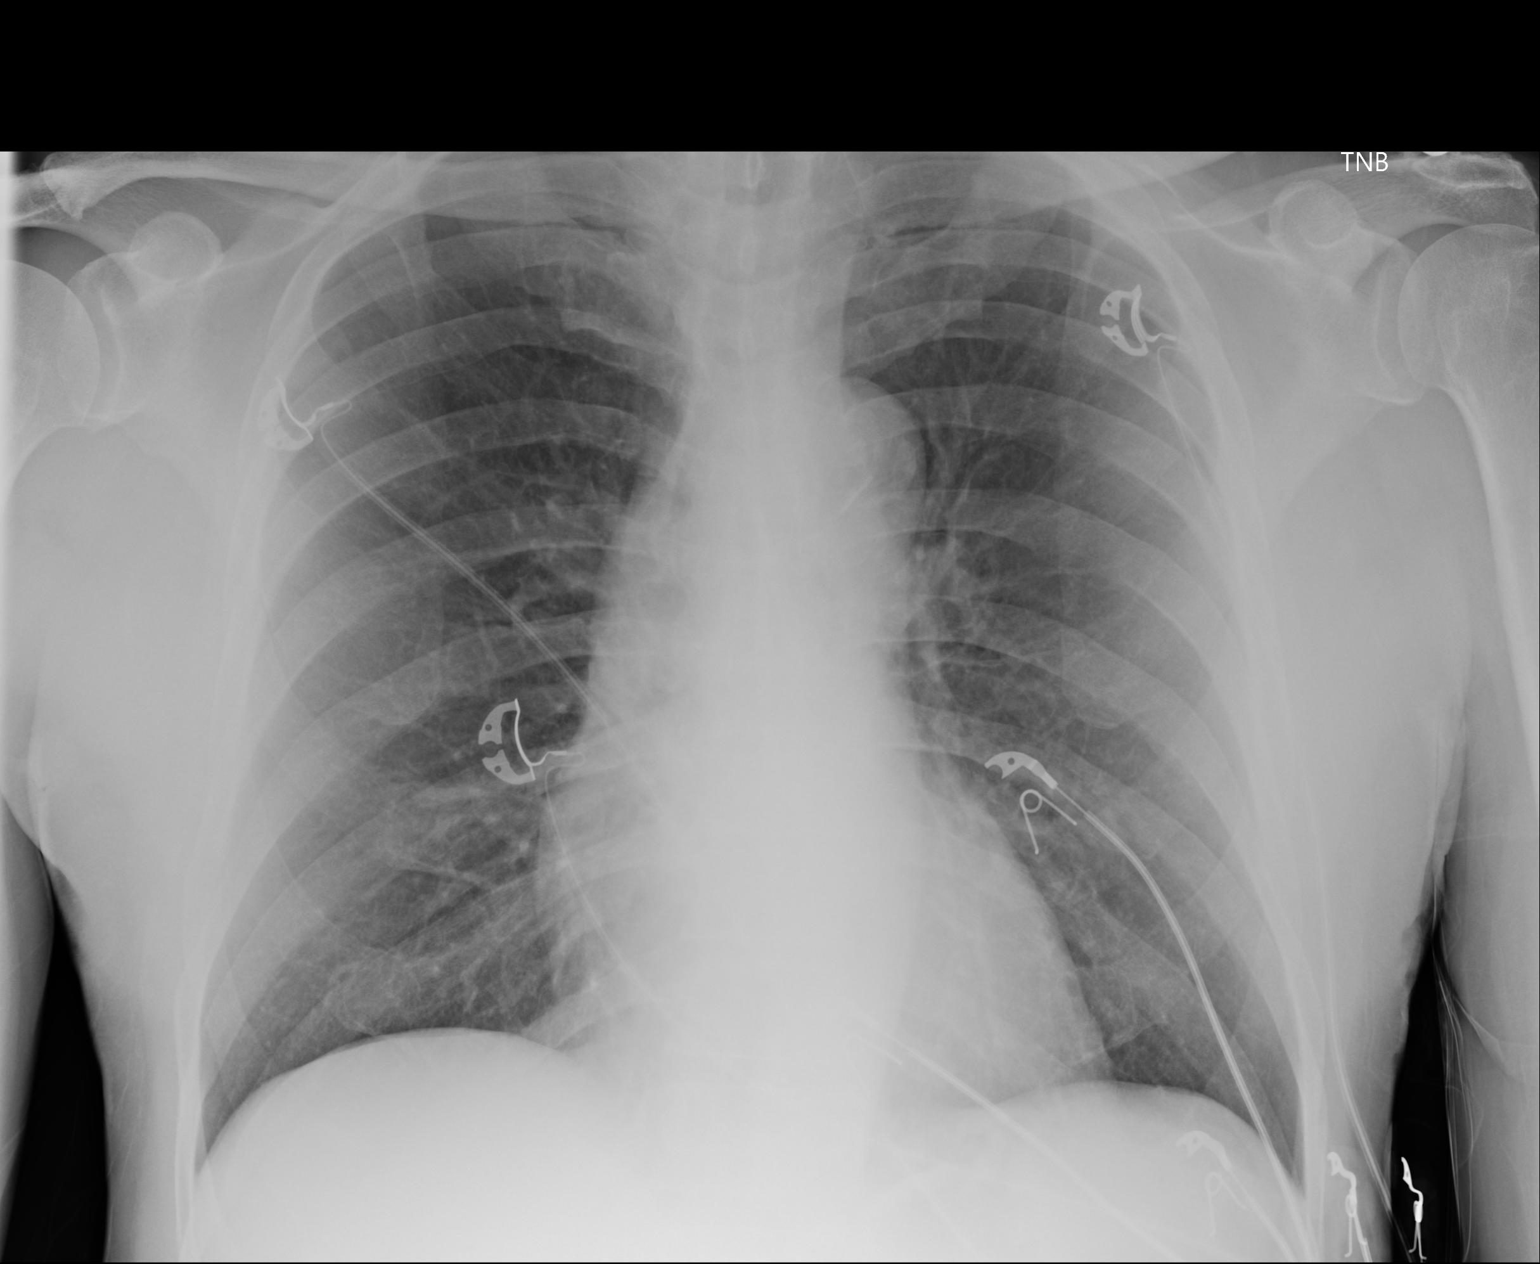

[1 of 1 positions shown; findings below may reference images not displayed]

FINDINGS: The heart size and mediastinal contours are within normal limits.
Both lungs are clear. The visualized skeletal structures are
unremarkable.
IMPRESSION: No acute cardiopulmonary findings.

## 2020-11-16 ENCOUNTER — Inpatient Hospital Stay
Admission: RE | Admit: 2020-11-16 | Discharge: 2020-11-16 | Disposition: A | Discharge status: Home / Self Care | Payer: Self-pay | Source: Ambulatory Visit | Attending: Interventional Radiology | Admitting: Interventional Radiology

## 2020-11-16 ENCOUNTER — Other Ambulatory Visit (HOSPITAL_COMMUNITY): Payer: Self-pay | Admitting: Interventional Radiology

## 2020-11-16 ENCOUNTER — Other Ambulatory Visit: Payer: Self-pay | Admitting: Internal Medicine

## 2020-11-16 DIAGNOSIS — C801 Malignant (primary) neoplasm, unspecified: Secondary | ICD-10-CM

## 2020-11-16 DIAGNOSIS — N2889 Other specified disorders of kidney and ureter: Secondary | ICD-10-CM

## 2020-11-22 ENCOUNTER — Other Ambulatory Visit: Payer: Self-pay

## 2020-11-22 ENCOUNTER — Encounter: Payer: Self-pay | Admitting: *Deleted

## 2020-11-22 ENCOUNTER — Ambulatory Visit
Admission: RE | Admit: 2020-11-22 | Discharge: 2020-11-22 | Disposition: A | Discharge status: Home / Self Care | Payer: Medicare Other | Source: Ambulatory Visit | Attending: Internal Medicine | Admitting: Internal Medicine

## 2020-11-22 DIAGNOSIS — N2889 Other specified disorders of kidney and ureter: Secondary | ICD-10-CM

## 2020-11-22 HISTORY — PX: IR RADIOLOGIST EVAL & MGMT: IMG5224

## 2020-11-22 NOTE — Consult Note (Signed)
Chief Complaint:  Indeterminate small right renal mass  Referring Physician(s): Ballantine,William C.  History of Present Illness: Zachary Baker is a 70 y.o. 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  He has been undergoing MRI surveillance at the [facility] in [place].  Upon review of his [place] records he has had very slow enlargement of a now 11 mm medial right midpole exophytic small renal lesion.  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  I only have one of his MRI scans for review from November 2021.  This does demonstrate an 11 mm lesion in the right midpole medially.  Today's visit is a telehealth visit to assess for image guided cryoablation.  Past Medical History:  Diagnosis Date  . Cancer of kidney ([place])   . Hypertension     No past surgical history on file.  Allergies: Patient has no known allergies.  Medications: Prior to Admission medications   Medication Sig Start Date End Date Taking? Authorizing Provider  aspirin EC 81 MG tablet Take 162 mg by mouth daily.    [provider]  atenolol (TENORMIN) 25 MG tablet Take 75 mg by mouth daily.     [provider]  atorvastatin (LIPITOR) 80 MG tablet Take 1 tablet by mouth daily.    [provider]  capsaicin (ZOSTRIX) 0.025 % cream Apply 1 application topically 2 (two) times daily as needed.    [provider]  chlorthalidone (HYGROTON) 25 MG tablet Take 1 tablet by mouth daily. 05/22/19   [provider]  hydrALAZINE (APRESOLINE) 100 MG tablet Take 1 tablet by mouth 2 (two) times daily.    [provider]  Omega-3 Fatty Acids (FISH OIL) 1000 MG CAPS Take 1 capsule by mouth daily.    [provider]  potassium chloride (K-DUR) 10 MEQ tablet Take 1 tablet by mouth 2 (two) times  daily. 06/29/19   [provider]  REFRESH 1.4-0.6 % SOLN Place 1 drop into both eyes daily as needed (for dry eye relief).  05/01/19   [provider]  spironolactone (ALDACTONE) 25 MG tablet Take 25 mg by mouth daily.    [provider]  tobramycin (TOBREX) 0.3 % ophthalmic solution Place 1 drop into both eyes 4 (four) times daily as needed.  04/30/19   [provider]     No family history on file.  Social History   Socioeconomic History  . Marital status: Divorced    Spouse name: Not on file  . Number of children: Not on file  . Years of education: Not on file  . Highest education level: Not on file  Occupational History  . Not on file  Tobacco Use  . Smoking status: Never Smoker  . Smokeless tobacco: Never Used  Substance and Sexual Activity  . Alcohol use: Yes    Alcohol/week: 2.0 standard drinks    Types: 2 Glasses of wine per week    Comment: daily   . Drug use: Not Currently  . Sexual activity: Not on file  Other Topics Concern  . Not on file  Social History Narrative  . Not on file   Social Determinants of Health   Financial Resource Strain: Not on file  Food Insecurity: Not on file  Transportation Needs: Not on file  Physical  Activity: Not on file  Stress: Not on file  Social Connections: Not on file     Review of Systems  Review of Systems: A 12 point ROS discussed and pertinent positives are indicated in the HPI above.  All other systems are negative.  Physical Exam No direct physical exam was performed, telephone health visit only today because of Covid pandemic   Vital Signs: There were no vitals taken for this visit.  Imaging: No results found.  Labs:  CBC: No results for input(s): WBC, HGB, HCT, PLT in the last 8760 hours.  COAGS: No results for input(s): INR, APTT in the last 8760 hours.  BMP: No results for input(s): NA, K, CL, CO2, GLUCOSE, BUN, CALCIUM, CREATININE, GFRNONAA, GFRAA in the last 8760  hours.  Invalid input(s): CMP  LIVER FUNCTION TESTS: No results for input(s): BILITOT, AST, ALT, ALKPHOS, PROT, ALBUMIN in the last 8760 hours.   Assessment and Plan:  Very slow enlargement of a now 11 mm exophytic right kidney midpole indeterminate lesion by MRI.  Previously the lesion measured 6 mm by report.  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  Small right renal lesion is now concerning for a renal neoplasm.  In detail, the image guided cryoablation procedure was reviewed.  The procedure, risk, benefits and alternatives were all reviewed.  He understands the procedure requires general anesthesia.  Other options include continued surveillance were reviewed.  At this point, I would like to obtain his prior MRIs dating back to 2020 and review all the scans prior to making a decision to either proceed with cryoablation versus continued surveillance.  After discussion he is agreement with that plan.  Plan: [name] dating back to [facility].  Following this imaging review, determine whether to proceed with CT-guided cryoablation versus continued surveillance.  Thank you for this interesting consult.  I greatly enjoyed meeting Zachary Baker and look forward to participating in their care.  A copy of this report was sent to the requesting provider on this date.  Electronically Signed: Berdine Dance 11/22/2020, 10:26 AM   I spent a total of  40 Minutes   in remote  clinical consultation, greater than 50% of which was counseling/coordinating care for this patient with a suspicious 11 mm right renal lesion..    Visit type: Audio only (telephone). Audio (no video) only due to patient's lack of internet/smartphone capability. Alternative for in-person consultation at [facility], [address] [place], [place], [place]. This visit type was conducted due to national recommendations for restrictions  regarding the COVID-19 Pandemic (e.g. social distancing).  This format is felt to be most appropriate for this patient at this time.  All issues noted in this document were discussed and addressed.

## 2020-11-28 ENCOUNTER — Other Ambulatory Visit: Payer: Self-pay | Admitting: Interventional Radiology

## 2020-11-28 ENCOUNTER — Ambulatory Visit
Admission: RE | Admit: 2020-11-28 | Discharge: 2020-11-28 | Disposition: A | Discharge status: Home / Self Care | Payer: Self-pay | Source: Ambulatory Visit | Attending: Interventional Radiology | Admitting: Interventional Radiology

## 2020-11-28 DIAGNOSIS — C801 Malignant (primary) neoplasm, unspecified: Secondary | ICD-10-CM

## 2020-12-13 ENCOUNTER — Other Ambulatory Visit: Payer: Self-pay | Admitting: Interventional Radiology

## 2020-12-13 ENCOUNTER — Ambulatory Visit
Admission: RE | Admit: 2020-12-13 | Discharge: 2020-12-13 | Disposition: A | Discharge status: Home / Self Care | Payer: Self-pay | Source: Ambulatory Visit | Attending: Interventional Radiology | Admitting: Interventional Radiology

## 2020-12-13 DIAGNOSIS — C801 Malignant (primary) neoplasm, unspecified: Secondary | ICD-10-CM

## 2021-03-16 ENCOUNTER — Other Ambulatory Visit: Payer: Self-pay | Admitting: Interventional Radiology

## 2021-03-16 DIAGNOSIS — N2889 Other specified disorders of kidney and ureter: Secondary | ICD-10-CM

## 2021-05-17 ENCOUNTER — Ambulatory Visit
Admission: RE | Admit: 2021-05-17 | Discharge: 2021-05-17 | Disposition: A | Discharge status: Home / Self Care | Payer: Self-pay | Source: Ambulatory Visit | Attending: Interventional Radiology | Admitting: Interventional Radiology

## 2021-05-17 ENCOUNTER — Other Ambulatory Visit: Payer: Self-pay | Admitting: Interventional Radiology

## 2021-05-17 DIAGNOSIS — N2889 Other specified disorders of kidney and ureter: Secondary | ICD-10-CM

## 2021-06-01 ENCOUNTER — Other Ambulatory Visit: Payer: Self-pay

## 2021-06-01 ENCOUNTER — Ambulatory Visit
Admission: RE | Admit: 2021-06-01 | Discharge: 2021-06-01 | Disposition: A | Discharge status: Home / Self Care | Payer: Medicare Other | Source: Ambulatory Visit | Attending: Interventional Radiology | Admitting: Interventional Radiology

## 2021-06-01 ENCOUNTER — Encounter: Payer: Self-pay | Admitting: *Deleted

## 2021-06-01 DIAGNOSIS — N2889 Other specified disorders of kidney and ureter: Secondary | ICD-10-CM

## 2021-06-01 HISTORY — PX: IR RADIOLOGIST EVAL & MGMT: IMG5224

## 2021-06-01 NOTE — Progress Notes (Signed)
Patient ID: Zachary Baker, male   DOB: 1951-07-10, 70 y.o.   MRN: [id number]       Chief Complaint:  Indeterminate small right renal mass  Referring Physician(s):  Dr. Maury Dus  History of Present Illness: Byran Bilotti is a 70 y.o. 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  He has been undergoing surveillance MRI at the [facility] in [place].  Surveillance MRI scans demonstrate an indeterminate 11 mm medial right midpole exophytic small renal lesion.  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  Most recent MRI scan from 05/09/2021 demonstrates the lesion is stable at 11 mm compared to 10/11/2020.  No new or enlarging renal abnormality.  Several right renal cysts again noted.  Past Medical History:  Diagnosis Date   Cancer of kidney ([place])    Hypertension     Past Surgical History:  Procedure Laterality Date   IR RADIOLOGIST EVAL & MGMT  11/22/2020    Allergies: Patient has no known allergies.  Medications: Prior to Admission medications   Medication Sig Start Date End Date Taking? Authorizing Provider  aspirin EC 81 MG tablet Take 162 mg by mouth daily.    [provider]  atenolol (TENORMIN) 25 MG tablet Take 75 mg by mouth daily.     [provider]  atorvastatin (LIPITOR) 80 MG tablet Take 1 tablet by mouth daily.    [provider]  capsaicin (ZOSTRIX) 0.025 % cream Apply 1 application topically 2 (two) times daily as needed.    [provider]  chlorthalidone (HYGROTON) 25 MG tablet Take 1 tablet by mouth daily. 05/22/19   [provider]  hydrALAZINE (APRESOLINE) 100 MG tablet Take 1 tablet by mouth 2 (two) times daily.    [provider]  Omega-3 Fatty Acids (FISH OIL) 1000 MG CAPS Take 1 capsule by mouth daily.    [provider]  potassium chloride (K-DUR) 10 MEQ tablet Take 1 tablet by mouth 2 (two) times daily. 06/29/19   [provider]  REFRESH 1.4-0.6 % SOLN Place 1 drop into both eyes daily as needed (for dry eye relief).  05/01/19   [provider]  spironolactone (ALDACTONE) 25 MG tablet Take 25 mg by mouth daily.    [provider]  tobramycin (TOBREX) 0.3 % ophthalmic solution Place 1 drop into both eyes 4 (four) times daily as needed.  04/30/19   [provider]     No family history on file.  Social History   Socioeconomic History   Marital status: Divorced    Spouse name: Not on file   Number of children: Not on file   Years of education: Not on file   Highest education level: Not on file  Occupational History   Not on file  Tobacco Use   Smoking status: Never   Smokeless tobacco: Never  Substance and Sexual Activity   Alcohol use: Yes    Alcohol/week: 2.0 standard drinks    Types: 2 Glasses of wine per week    Comment: daily    Drug use: Not Currently   Sexual activity: Not on file  Other Topics Concern   Not on file  Social History Narrative   Not on file   Social Determinants of Health   Financial Resource Strain: Not on file  Food Insecurity: Not on file  Transportation Needs: Not on file  Physical Activity: Not on file  Stress: Not on file  Social Connections: Not on file    Review of Systems  Review of Systems: A 12 point ROS discussed and pertinent positives are indicated in the HPI above.  All other systems are negative.  Physical Exam No direct physical exam was performed telephone health visit only today because of COVID pandemic  Vital Signs: There were no vitals taken for this visit.  Imaging: No results found.  Labs:  CBC: No results for input(s): WBC, HGB, HCT, PLT in the last 8760 hours.  COAGS: No results for input(s): INR, APTT in the last 8760 hours.  BMP: No results for input(s): NA, K, CL, CO2,  GLUCOSE, BUN, CALCIUM, CREATININE, GFRNONAA, GFRAA in the last 8760 hours.  Invalid input(s): CMP  LIVER FUNCTION TESTS: No results for input(s): BILITOT, AST, ALT, ALKPHOS, PROT, ALBUMIN in the last 8760 hours.   Assessment and Plan:  Stable 11 mm exophytic right kidney midpole indeterminate lesion medially by MRI.  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  The small indeterminate right renal lesion remains stable compared to 10/10/2020.  Imaging findings reviewed with the patient by telephone health visit today.  All questions addressed.  Stability of the lesion remains encouraging for continued surveillance at this time.  He is in agreement with this plan.  Plan: Continue surveillance MRI in 6 months to be performed at the [facility].   Electronically Signed: Greggory Keen 06/01/2021, 11:40 AM   I spent a total of    40 Minutes in remote  clinical consultation, greater than 50% of which was counseling/coordinating care for this patient with an indeterminate 11 mm right renal lesion.  Visit type: Audio only (telephone). Audio (no video) only due to patient's lack of internet/smartphone capability. Alternative for in-person consultation at [facility], [address], [place], [place]. This visit type was conducted due to national recommendations for restrictions regarding the COVID-19 Pandemic (e.g. social distancing).  This format is felt to be most appropriate for this patient at this time.  All issues noted in this document were discussed and addressed.

## 2021-11-27 ENCOUNTER — Other Ambulatory Visit: Payer: Self-pay | Admitting: Interventional Radiology

## 2021-11-27 DIAGNOSIS — N2889 Other specified disorders of kidney and ureter: Secondary | ICD-10-CM
